# Patient Record
Sex: Male | Born: 1995 | Race: White | Hispanic: Yes | Marital: Single | Smoking: Never smoker
Health system: Southern US, Community
[De-identification: ages and names within clinical notes are randomized; demographics above are authoritative.]

---
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
## 2017-01-21 ENCOUNTER — Encounter (HOSPITAL_BASED_OUTPATIENT_CLINIC_OR_DEPARTMENT_OTHER): Payer: Self-pay | Admitting: *Deleted

## 2017-01-21 ENCOUNTER — Emergency Department (HOSPITAL_BASED_OUTPATIENT_CLINIC_OR_DEPARTMENT_OTHER)
Admission: EM | Admit: 2017-01-21 | Discharge: 2017-01-21 | Disposition: A | Discharge status: Home / Self Care | Payer: Self-pay | Attending: Emergency Medicine | Admitting: Emergency Medicine

## 2017-01-21 ENCOUNTER — Other Ambulatory Visit: Payer: Self-pay

## 2017-01-21 DIAGNOSIS — J111 Influenza due to unidentified influenza virus with other respiratory manifestations: Secondary | ICD-10-CM | POA: Insufficient documentation

## 2017-01-21 DIAGNOSIS — R6889 Other general symptoms and signs: Secondary | ICD-10-CM

## 2017-01-21 LAB — RAPID STREP SCREEN (MED CTR MEBANE ONLY): STREPTOCOCCUS, GROUP A SCREEN (DIRECT): NEGATIVE

## 2017-01-21 MED ORDER — ACETAMINOPHEN 500 MG PO TABS
1000.0000 mg | ORAL_TABLET | Freq: Once | ORAL | Status: AC
  Administered 2017-01-21: 1000 mg via ORAL
  Filled 2017-01-21: qty 2

## 2017-01-21 MED ORDER — IBUPROFEN 600 MG PO TABS: 600.0000 mg | ORAL_TABLET | Freq: Four times a day (QID) | ORAL | 0 refills | Status: AC | PRN

## 2017-01-21 MED ORDER — OSELTAMIVIR PHOSPHATE 75 MG PO CAPS: 75.0000 mg | ORAL_CAPSULE | Freq: Two times a day (BID) | ORAL | 0 refills | Status: AC

## 2017-01-21 MED ORDER — IBUPROFEN 800 MG PO TABS
800.0000 mg | ORAL_TABLET | Freq: Once | ORAL | Status: AC
  Administered 2017-01-21: 800 mg via ORAL
  Filled 2017-01-21: qty 1

## 2017-01-21 NOTE — ED Notes (Signed)
EDP into room, pt/family updated on results and plan. Fever reduced, remains febrile. States, "feel better". VS improved.

## 2017-01-21 NOTE — ED Notes (Signed)
Tolerating water, meds and apple sauce

## 2017-01-21 NOTE — Discharge Instructions (Signed)
You were seen today for fever, body aches, sore throat, cough.  You may have the flu.  Make sure that you are staying hydrated at home.  Use Tylenol or ibuprofen as needed for fevers and body aches.  You may take Tamiflu.  If you develop any new or worsening symptoms you should be reevaluated.

## 2017-01-21 NOTE — ED Provider Notes (Signed)
[facility] EMERGENCY DEPARTMENT Provider Note   CSN: [id number] Arrival date & time: 01/21/17  0131     History   Chief Complaint Chief Complaint  Patient presents with  . Fever    HPI Douglas Velasquez is a 21 y.o. male.  HPI  This is a 21 year old male who presents with fever, sore throat, myalgias, cough.  Onset of symptoms yesterday.  Noted fever of 104.1 at home.  Has not taking any medications.  Reports fever, cough, body aches, myalgias, headache.  Currently rates his discomfort at 8 out of 10.  No known sick contacts.  He has not had any nausea, vomiting, chest pain, abdominal pain, shortness of breath.  No known sick contacts.  History reviewed. No pertinent past medical history.  There are no active problems to display for this patient.   History reviewed. No pertinent surgical history.     Home Medications    Prior to Admission medications   Medication Sig Start Date End Date Taking? Authorizing Provider  ibuprofen (ADVIL,MOTRIN) 600 MG tablet Take 1 tablet (600 mg total) by mouth every 6 (six) hours as needed for fever. 01/21/17   Horton, Mayer Maskerourtney F, MD  oseltamivir (TAMIFLU) 75 MG capsule Take 1 capsule (75 mg total) by mouth every 12 (twelve) hours. 01/21/17   Horton, Mayer Maskerourtney F, MD    Family History History reviewed. No pertinent family history.  Social History Social History   Tobacco Use  . Smoking status: Never Smoker  . Smokeless tobacco: Never Used  Substance Use Topics  . Alcohol use: No    Frequency: Never  . Drug use: No     Allergies   Patient has no known allergies.   Review of Systems Review of Systems  Constitutional: Positive for chills and fever.  HENT: Positive for sore throat. Negative for congestion and trouble swallowing.   Respiratory: Positive for cough. Negative for shortness of breath.   Cardiovascular: Negative for chest pain.  Gastrointestinal: Negative for abdominal pain, nausea and vomiting.   Skin: Negative for rash.  All other systems reviewed and are negative.    Physical Exam Updated Vital Signs BP 119/65   Pulse (!) 110   Temp (!) 102 F (38.9 C) (Oral)   Resp 20   Ht 5\' 7"  (1.702 m)   Wt 65.8 kg (145 lb)   SpO2 97%   BMI 22.71 kg/m   Physical Exam  Constitutional: He is oriented to person, place, and time. He appears well-developed and well-nourished. No distress.  HENT:  Head: Normocephalic and atraumatic.  Oropharynx with mild erythema, uvula midline, no petechiae or exudate noted  Eyes: Pupils are equal, round, and reactive to light.  Neck: Neck supple.  Cardiovascular: Regular rhythm and normal heart sounds.  No murmur heard. Tachycardia  Pulmonary/Chest: Effort normal and breath sounds normal. No respiratory distress. He has no wheezes.  Abdominal: Soft. Bowel sounds are normal. There is no tenderness. There is no rebound.  Musculoskeletal: He exhibits no edema.  Lymphadenopathy:    He has cervical adenopathy.  Neurological: He is alert and oriented to person, place, and time.  Skin: Skin is warm and dry. He is not diaphoretic.  Psychiatric: He has a normal mood and affect.  Nursing note and vitals reviewed.    ED Treatments / Results  Labs (all labs ordered are listed, but only abnormal results are displayed) Labs Reviewed  RAPID STRE[facility] SCREEN (NOT [facility])  CULTURE, GROUP A STREP [facility])    EKG  EKG Interpretation None       Radiology No results found.  Procedures Procedures (including critical care time)  Medications Ordered in ED Medications  acetaminophen (TYLENOL) tablet 1,000 mg (1,000 mg Oral Given 01/21/17 0149)  ibuprofen (ADVIL,MOTRIN) tablet 800 mg (800 mg Oral Given 01/21/17 0235)     Initial Impression / Assessment and Plan / ED Course  I have reviewed the triage vital signs and the nursing notes.  Pertinent labs & imaging results that were available during my care of the patient were reviewed by me and  considered in my medical decision making (see chart for details).     Patient presents with fever, sore throat, headache, myalgias, cough.  He is overall nontoxic appearing.  He is febrile and tachycardic.  Strep is a consideration.  He is 2 out of 4 Centor criteria positive.  Strep screen sent and negative.  Flu is also a consideration.  Patient was given Tylenol and ibuprofen for his fever.  He declined fluids but orally hydrated without difficulty.  He remained febrile but with improving temperature after observation of 102.  Heart rate improved from the 130s to the 110s.  I suspect patient may have influenza.  No indication at this time for x-ray as breath sounds are clear.  Patient was offered Tamiflu.  Recommend aggressive hydration, ibuprofen and Tylenol as needed for fever and myalgias.  After history, exam, and medical workup I feel the patient has been appropriately medically screened and is safe for discharge home. Pertinent diagnoses were discussed with the patient. Patient was given return precautions.   Final Clinical Impressions(s) / ED Diagnoses   Final diagnoses:  Flu-like symptoms    ED Discharge Orders        Ordered    ibuprofen (ADVIL,MOTRIN) 600 MG tablet  Every 6 hours PRN     01/21/17 0411    oseltamivir (TAMIFLU) 75 MG capsule  Every 12 hours     01/21/17 0411       Shon BatonHorton, Courtney F, MD 01/21/17 [phone]

## 2017-01-21 NOTE — ED Triage Notes (Signed)
C/o fever and sore throat, mentions cough. No meds PTA, NKDA.

## 2017-01-21 NOTE — ED Notes (Signed)
Alert, NAD, calm, interactive, resps e/u, speaking in clear complete sentences, no dyspnea noted, skin W&D, VSS, c/o fever 104.1 oral at home, no meds PTA) and sore throat, also mentions HA, body aches, cough (denies: sob, NVD, dizziness, eye or ear sx, facial pain, congestion, abd pain or visual changes). [facility]

## 2017-01-23 LAB — CULTURE, GROUP A STREP (THRC)
